# Patient Record
Sex: Male | Born: 1970 | Race: White | Hispanic: No | Marital: Single | Smoking: Current every day smoker
Health system: Southern US, Community
[De-identification: ages and names within clinical notes are randomized; demographics above are authoritative.]

## PROBLEM LIST (undated history)

## (undated) DIAGNOSIS — M549 Dorsalgia, unspecified: Secondary | ICD-10-CM

## (undated) DIAGNOSIS — R569 Unspecified convulsions: Secondary | ICD-10-CM

---

## 2001-10-28 ENCOUNTER — Ambulatory Visit (HOSPITAL_BASED_OUTPATIENT_CLINIC_OR_DEPARTMENT_OTHER): Admission: RE | Admit: 2001-10-28 | Discharge: 2001-10-28 | Payer: Self-pay | Admitting: Oral Surgery

## 2003-11-22 ENCOUNTER — Emergency Department (HOSPITAL_COMMUNITY): Admission: EM | Admit: 2003-11-22 | Discharge: 2003-11-22 | Payer: Self-pay | Admitting: Emergency Medicine

## 2003-11-25 ENCOUNTER — Ambulatory Visit (HOSPITAL_COMMUNITY): Admission: RE | Admit: 2003-11-25 | Discharge: 2003-11-25 | Payer: Self-pay | Admitting: Family Medicine

## 2004-02-21 ENCOUNTER — Ambulatory Visit: Payer: Self-pay | Admitting: Family Medicine

## 2004-04-11 ENCOUNTER — Ambulatory Visit: Payer: Self-pay | Admitting: Family Medicine

## 2004-09-21 ENCOUNTER — Ambulatory Visit: Payer: Self-pay | Admitting: Family Medicine

## 2004-10-03 ENCOUNTER — Ambulatory Visit: Payer: Self-pay | Admitting: Family Medicine

## 2005-05-17 ENCOUNTER — Ambulatory Visit: Payer: Self-pay | Admitting: Family Medicine

## 2005-05-23 ENCOUNTER — Ambulatory Visit: Payer: Self-pay | Admitting: Family Medicine

## 2005-10-29 IMAGING — CR DG HIP (WITH OR WITHOUT PELVIS) 2-3V*L*
3 series · 3 of 3 positions shown · non-contrast
Comparison: none

CLINICAL DATA: Low back pain and left hip pain.
 COMPLETE LUMBAR SPINE ? 4 VIEWS ? 11/25/03 
 The patient has only four typical lumbar segments.  There is no disk space narrowing, facet joint arthritis, pars defect, or other significant abnormality.
 IMPRESSION 
 No significant abnormality of the lumbar spine. The patient does have only four typical lumbar segments, an anatomic variant.
 LEFT HIP ? 2 VIEWS ? 11/25/03
 There is no evidence of fracture or dislocation.  No other significant bone or soft tissue abnormalities are identified.  The joint spaces are within normal limits.
 IMPRESSION
 Normal study.

[view not recorded (1 of 3)]
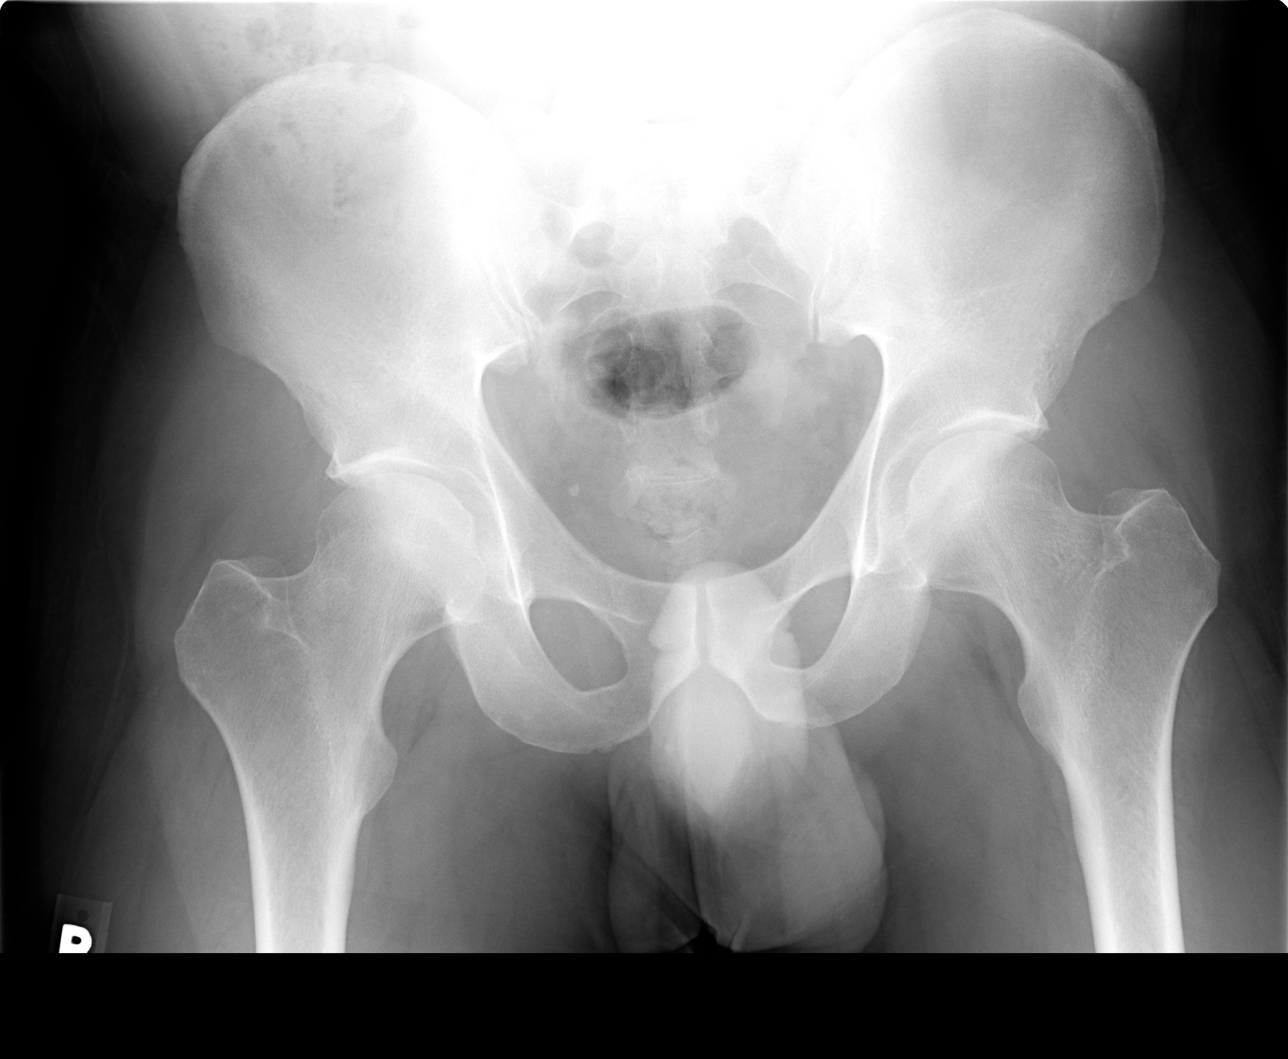

[view not recorded (2 of 3)]
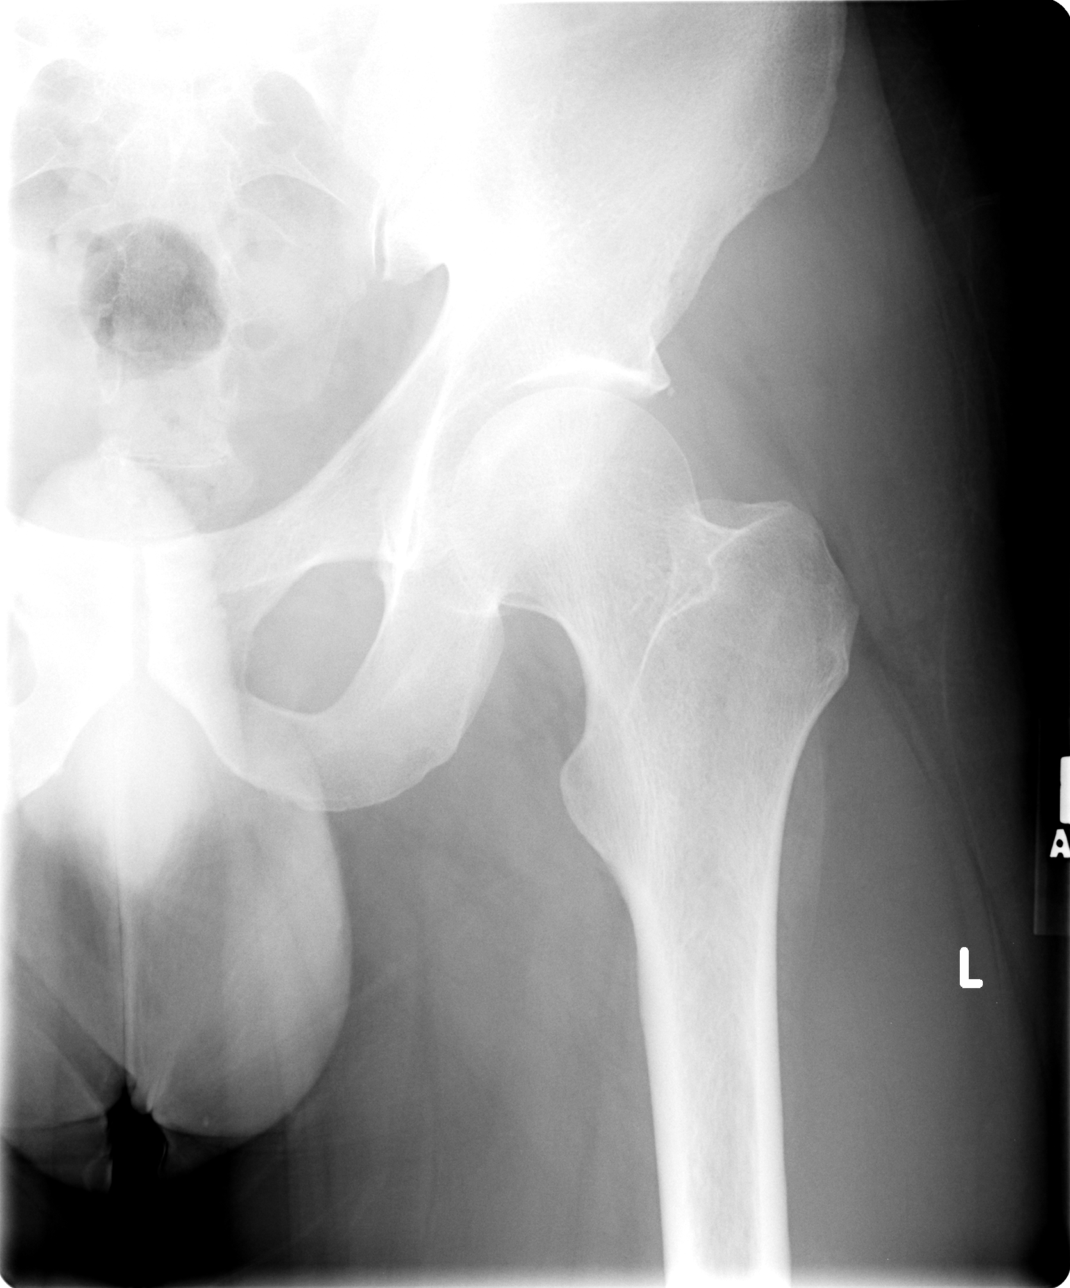

[view not recorded (3 of 3)]
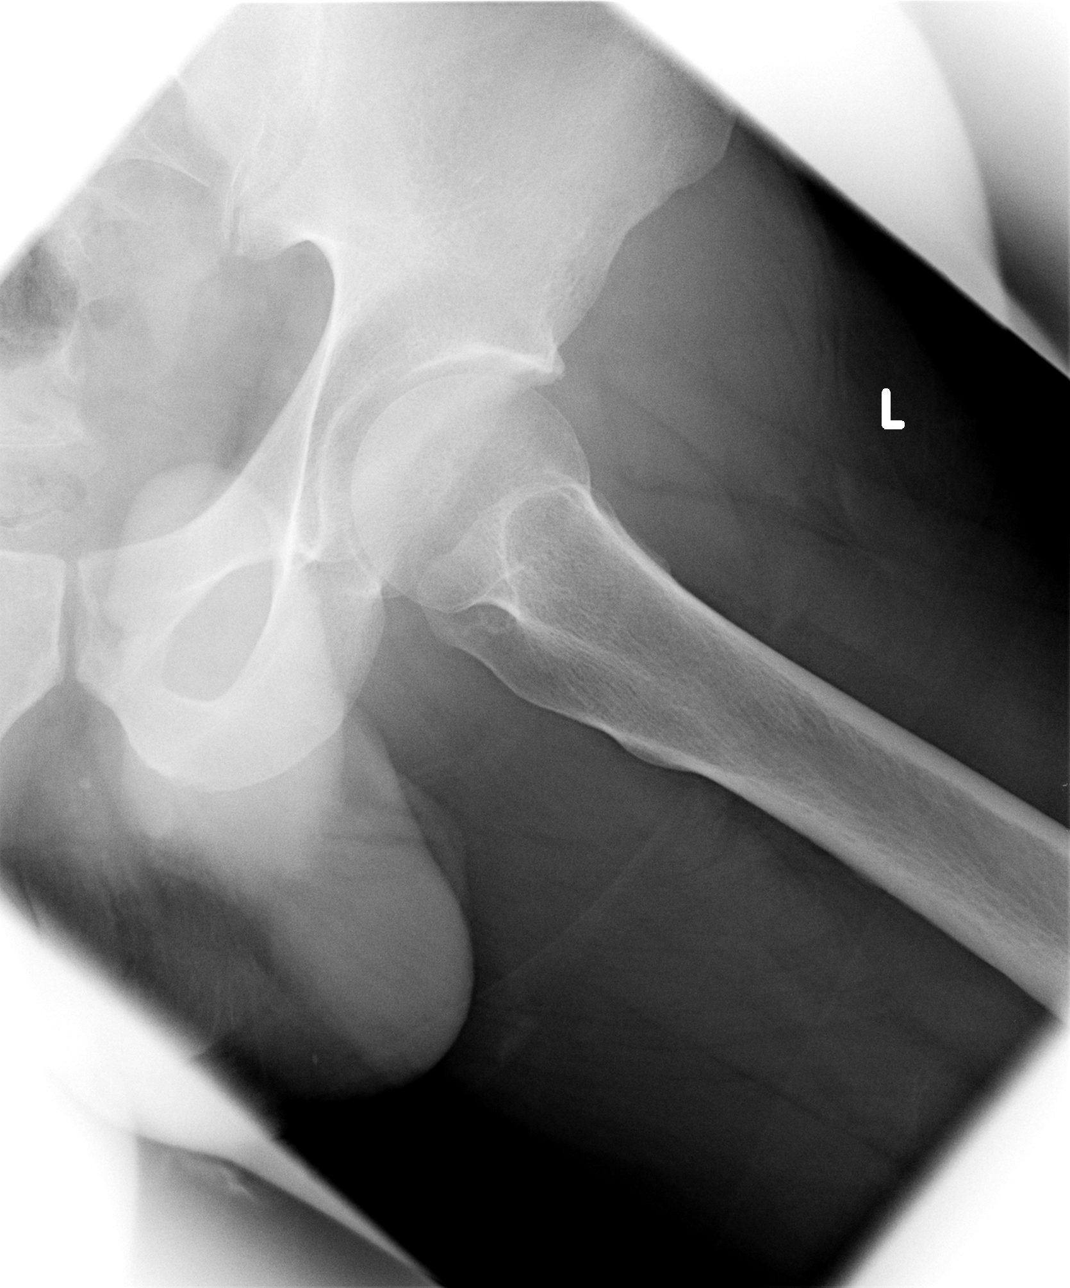

[3 of 3 positions shown; findings below may reference images not displayed]

## 2006-06-25 ENCOUNTER — Ambulatory Visit: Payer: Self-pay | Admitting: Family Medicine

## 2007-11-04 ENCOUNTER — Encounter
Admission: RE | Admit: 2007-11-04 | Discharge: 2007-11-04 | Payer: Self-pay | Admitting: Physical Medicine & Rehabilitation

## 2010-04-08 ENCOUNTER — Encounter: Payer: Self-pay | Admitting: Neurosurgery

## 2010-04-09 ENCOUNTER — Encounter: Payer: Self-pay | Admitting: Neurosurgery

## 2010-08-04 NOTE — Op Note (Signed)
[name], [name]                            ACCOUNT NO.:  [id number]   MEDICAL RECORD NO.:  [id number]                   PATIENT TYPE:   LOCATION:                                       FACILITY:   PHYSICIAN:  Georgia Lopes, M.D.               DATE OF BIRTH:   DATE OF PROCEDURE:  DATE OF DISCHARGE:                                 OPERATIVE REPORT   PREOPERATIVE DIAGNOSIS:  Multiple nonrestorable teeth #1, 2, 3, 4, 5, 7, 8,  9, 10, 12, 13, 14, 15, 17, 18, 19, 20, 21, 22, 23, 24, 25, 26, 27, 28, 29,  30, 31 and 32.   POSTOPERATIVE DIAGNOSIS:  Multiple nonrestorable teeth #1, 2, 3, 4, 5, 7, 8,  9, 10, 12, 13, 14, 15, 17, 18, 19, 20, 21, 22, 23, 24, 25, 26, 27, 28, 29,  30, 31 and 32.   PROCEDURES:  1. Removal of teeth numbers as above.  2. Alveoloplasty, four quadrant.   SURGEON:  Georgia Lopes, M.D.   ANESTHESIA:  General nasal.   ATTENDING:  Dr. Katrinka Blazing.   PROCEDURE:  The patient was taken to the operating room and placed on the  table in the supine position.  General anesthesia was administered  intravenously and a nasotracheal tube was placed atraumatically and secured.  The eyes were lubricated and protected.  A throat pack was placed and the  patient was prepped and draped for surgery.  Lidocaine, 2%, 1:100,000  epinephrine was infiltrated in the inferior alveolar block on the right and  left sides and in buccal and palatal infiltration of the maxilla.  A total  of eight carpules were used.  Then a bite block was placed on the right side  of the mouth and the left mandible was operated on first.  A #15 blade was  used to make a full-thickness incision overlying tooth #17.  This incision  was carried buccally in the gingival sulcus to tooth #27 and it was also  performed on the lingual side of the teeth.  The periosteum was reflected  with a periosteal elevator and then a 301 elevator was used to elevate all  teeth except for the third molar.  The lower teeth were  then removed with a  Calhorn forceps, a 150 forceps and an Asch forceps.  Tooth #17 was removed  using the Stryker handpiece with the Fisher bur to make a bony trough around  the tooth.  The tooth was then sectioned and removed.  The sockets were then  curetted.  An egg-shaped bur was then used to perform alveoloplasty and then  the area was irrigated and closed with 3-0 chromic.   In the left maxilla, a 15-blade was used to make a full-thickness incision  around the necks of teeth #15 - 8.  The periosteum was reflected.  The teeth  were elevated with a 301 elevator and  removed with a #150 forceps.  The  maxillary teeth #14 and 15 fractured and roots were removed with a root-tip  pick.  Then the sockets were curetted, irrigated.  Alveoloplasty was  performed with an egg-shaped bur and then the area was closed with 3-0  chromic.  The bite block was repositioned and in the right mandible teeth  #27 - 32 were removed using a #15 blade for a full-thickness incision, a 301  elevator, Calhorn forceps, 151 forceps and an Asch forceps.  Teeth #30 and  31 fractured during elevation and these teeth were sectioned with a  handpiece and the roots were removed independently with an Child psychotherapist.  Then tooth #32 was removed using a 702 Fisher bur which removed  bone around this tooth.  The tooth was sectioned and then removed in  multiple pieces.  The sockets were then curetted and an egg-shaped bur was  used to perform the alveoplasty.  The area was then irrigated and closed  with 3-0 chromic.   On the left maxilla, teeth #1 - 6 were removed with a full-thickness flap.  Using the periosteal elevator, a #15 blade and a 301 elevator was used to  elevate these teeth.  An upper 150 forceps was used to remove the tooth  from the mouth.  The sockets were then curetted, irrigated and an  alveoplasty was performed.  Then the area was irrigated and closed with 3-0  chromic.   Then 0.5% Marcaine,  1:200,000 epinephrine was infiltrated in the inferior  alveolar buccal on the right and left side and buccal infiltration of the  maxilla, a total five carpules were used.  At the termination of the  procedure, the oral cavity was suctioned.  The throat pack was removed.  Gauze sponges were placed in the mouth and then an oral area was placed. The  patient was awakened, extubated and taken to the recovery room, breathing  spontaneously, in good condition.   ESTIMATED BLOOD LOSS:  Minimum.   COMPLICATIONS:  None.   SPECIMENS:  None.                                               Georgia Lopes, M.D.    SMJ/MEDQ  D:  10/28/2001  T:  10/30/2001  Job:  [id number]

## 2012-06-06 ENCOUNTER — Encounter (HOSPITAL_COMMUNITY): Payer: Self-pay | Admitting: *Deleted

## 2012-06-06 ENCOUNTER — Emergency Department (HOSPITAL_COMMUNITY)
Admission: EM | Admit: 2012-06-06 | Discharge: 2012-06-06 | Disposition: A | Discharge status: Home / Self Care | Payer: Medicare Other | Attending: Emergency Medicine | Admitting: Emergency Medicine

## 2012-06-06 DIAGNOSIS — R569 Unspecified convulsions: Secondary | ICD-10-CM | POA: Insufficient documentation

## 2012-06-06 HISTORY — DX: Unspecified convulsions: R56.9

## 2012-06-06 LAB — POCT I-STAT, CHEM 8
Chloride: 109 mEq/L (ref 96–112)
HCT: 52 % (ref 39.0–52.0)
Hemoglobin: 17.7 g/dL — ABNORMAL HIGH (ref 13.0–17.0)
Potassium: 3.7 mEq/L (ref 3.5–5.1)
Sodium: 140 mEq/L (ref 135–145)

## 2012-06-06 LAB — PHENOBARBITAL LEVEL: Phenobarbital: 7.8 ug/mL — ABNORMAL LOW (ref 15.0–40.0)

## 2012-06-06 MED ORDER — LEVETIRACETAM 500 MG PO TABS
1000.0000 mg | ORAL_TABLET | Freq: Two times a day (BID) | ORAL | Status: DC
  Administered 2012-06-06: 1000 mg via ORAL
  Filled 2012-06-06 (×2): qty 2

## 2012-06-06 MED ORDER — PHENOBARBITAL 16.2 MG PO TABS
16.2000 mg | ORAL_TABLET | Freq: Every day | ORAL | Status: DC
  Administered 2012-06-06: 16.2 mg via ORAL

## 2012-06-06 MED ORDER — PHENOBARBITAL 32.4 MG PO TABS: 32.4000 mg | ORAL_TABLET | Freq: Every day | ORAL | Status: DC

## 2012-06-06 MED ORDER — LORAZEPAM 2 MG/ML IJ SOLN
1.0000 mg | Freq: Once | INTRAMUSCULAR | Status: AC
  Administered 2012-06-06: 1 mg via INTRAVENOUS
  Filled 2012-06-06: qty 1

## 2012-06-06 NOTE — ED Notes (Signed)
Per report from the rapid response nurse.  Pt was upstairs in the waiting area, during this time he looked over at one of the other visitors and said "I am about to have a seizure".  Visitor states that he then had a blank look on his face and began to have a seizure.  The seizure was reported to be tonic/clonic full body and lasted for 10 mins.  On arrival to ER pt is noted to be postictal but does open his eyes to voice.  No urinary incontinence noted.

## 2012-06-06 NOTE — ED Notes (Signed)
Pt wife called, pt is sleeping. Provided wife's number. Pt asking what time is it, states he has a h/a

## 2012-06-06 NOTE — ED Notes (Signed)
AIDET performed. 

## 2012-06-06 NOTE — ED Provider Notes (Addendum)
History     CSN: [id number]  Arrival date & time 06/06/12  1035   First MD Initiated Contact with Patient 06/06/12 1038      No chief complaint on file.   (Consider location/radiation/quality/duration/timing/severity/associated sxs/prior treatment) Patient is a 42 y.o. male presenting with seizures. The history is provided by a friend (rapid response nurse). The history is limited by the condition of the patient.  Seizures Seizure activity on arrival: yes   Seizure type:  Grand mal Preceding symptoms: aura   Preceding symptoms comment:  Pt was visiting a person in the hospital.  he told them he was going to have a sz and then started szing. Initial focality:  None Episode characteristics: generalized shaking and unresponsiveness   Postictal symptoms: confusion   Return to baseline: no   Severity:  Moderate Duration:  5 minutes Timing:  Once Number of seizures this episode:  1 Progression:  Partially resolved Context: not alcohol withdrawal   Context comment:  Unknown if taking meds or any med changes History of seizures: yes     No past medical history on file.  No past surgical history on file.  No family history on file.  History  Substance Use Topics  . Smoking status: Not on file  . Smokeless tobacco: Not on file  . Alcohol Use: Not on file      Review of Systems  Unable to perform ROS Neurological: Positive for seizures.    Allergies  Review of patient's allergies indicates not on file.  Home Medications  No current outpatient prescriptions on file.  BP 190/102  Pulse 109  Temp(Src) 97.9 F (36.6 C) (Oral)  Resp 22  SpO2 100%  Physical Exam  Nursing note and vitals reviewed. Constitutional: He appears well-developed and well-nourished. No distress.  Patient is post ictal on exam  HENT:  Head: Normocephalic and atraumatic.  Mouth/Throat: Oropharynx is clear and moist.  Eyes: Conjunctivae and EOM are normal. Pupils are equal, round, and  reactive to light.  Neck: Normal range of motion. Neck supple.  Cardiovascular: Regular rhythm and intact distal pulses.  Tachycardia present.   No murmur heard. Pulmonary/Chest: Effort normal and breath sounds normal. No respiratory distress. He has no wheezes. He has no rales.  Abdominal: Soft. He exhibits no distension. There is no tenderness. There is no rebound and no guarding.  Musculoskeletal: Normal range of motion. He exhibits no edema and no tenderness.  Neurological: He has normal strength. No sensory deficit.  Patient does move all 4 extremities. He opens his eyes to voice but currently is not speaking and appears confused  Skin: Skin is warm. No rash noted. He is diaphoretic. No erythema.  Psychiatric: He has a normal mood and affect. His behavior is normal.    ED Course  Procedures (including critical care time)  Labs Reviewed  PHENOBARBITAL LEVEL - Abnormal; Notable for the following:    Phenobarbital 7.8 (*)    All other components within normal limits  POCT I-STAT, CHEM 8 - Abnormal; Notable for the following:    BUN 4 (*)    Glucose, Bld 161 (*)    Calcium, Ion 1.25 (*)    Hemoglobin 17.7 (*)    All other components within normal limits   No results found.   Date: 06/06/2012  Rate: 113  Rhythm: sinus tachycardia  QRS Axis: normal  Intervals: normal  ST/T Wave abnormalities: normal  Conduction Disutrbances:none  Narrative Interpretation:   Old EKG Reviewed: none available  1. Seizure       MDM   Patient was brought down from upstairs in the hospital after he had a 5-10 minute generalized tonic-clonic seizure. Patient has a history of seizures and prior to seizing told the person he came to visit that he was going to have a seizure. He is currently not seizing and does respond to voice. He is currently postictal. Currently unable to determine what medications he is on. Will give patient 1 mg of Ativan to prevent further seizing. EKG shows a sinus  tachycardia. Will outpatient to wake up so he can tell us his medications. There was no injury as patient was sitting in a chair when he started seizing and the nurse who was called states he was still in the chair when she got there.  12:06 PM Patient is on phenobarbital and Keppra. His phenobarbital level today is low at 7.8.  12:17 PM Patient is currently asleep and still mildly postictal. Will allow completely resolved before discharge.  Discussing with pt he has not had meds for over a week due to not picking them up from the drug store.  This would explain sz and subtherapeutic levels    Gwyneth Sprout, MD 06/06/12 1217  Gwyneth Sprout, MD 06/06/12 1259  Gwyneth Sprout, MD 06/06/12 1544

## 2012-06-06 NOTE — ED Notes (Signed)
Pt is becoming more alert.  He is oriented to place and day of week.  He states that he takes phenobarbital and keppra.

## 2015-04-16 ENCOUNTER — Emergency Department (HOSPITAL_COMMUNITY)
Admission: EM | Admit: 2015-04-16 | Discharge: 2015-04-16 | Disposition: A | Discharge status: Home / Self Care | Payer: Medicare Other | Attending: Emergency Medicine | Admitting: Emergency Medicine

## 2015-04-16 ENCOUNTER — Encounter (HOSPITAL_COMMUNITY): Payer: Self-pay | Admitting: Emergency Medicine

## 2015-04-16 DIAGNOSIS — F17210 Nicotine dependence, cigarettes, uncomplicated: Secondary | ICD-10-CM | POA: Insufficient documentation

## 2015-04-16 DIAGNOSIS — Z79899 Other long term (current) drug therapy: Secondary | ICD-10-CM | POA: Diagnosis not present

## 2015-04-16 DIAGNOSIS — Z760 Encounter for issue of repeat prescription: Secondary | ICD-10-CM | POA: Diagnosis present

## 2015-04-16 DIAGNOSIS — M545 Low back pain, unspecified: Secondary | ICD-10-CM

## 2015-04-16 LAB — URINALYSIS, ROUTINE W REFLEX MICROSCOPIC
Bilirubin Urine: NEGATIVE
Glucose, UA: NEGATIVE mg/dL
Hgb urine dipstick: NEGATIVE
Ketones, ur: NEGATIVE mg/dL
Leukocytes, UA: NEGATIVE
Nitrite: NEGATIVE
Protein, ur: NEGATIVE mg/dL
Specific Gravity, Urine: 1.015 (ref 1.005–1.030)
pH: 7 (ref 5.0–8.0)

## 2015-04-16 MED ORDER — PHENOBARBITAL 16.2 MG PO TABS: 16.2000 mg | ORAL_TABLET | Freq: Two times a day (BID) | ORAL | Status: DC

## 2015-04-16 NOTE — ED Notes (Signed)
Patient requesting medication refill. Per patient has ran out of his phenobarbital, in which he takes twice a day for seizures. Per patient last dose yesterday and doesn't have appointment with PCP until 2/5. Patient also c/o lower back pain that radiates into testicles. Per patient painful urination. Denies any discharge or swelling. Patient reports taking ibuprofen, tylenol, ice, and heat with no relief.

## 2015-04-16 NOTE — ED Provider Notes (Signed)
CSN: [id number]     Arrival date & time 04/16/15  1509 History   First MD Initiated Contact with Patient 04/16/15 1526     Chief Complaint  Patient presents with  . Medication Refill     (Consider location/radiation/quality/duration/timing/severity/associated sxs/prior Treatment) HPI  45 year old male requesting for refill of phenobarbital.Has been on this for years. Recently ran out does not have means to have a refill for a few days. He reports he has adequate supply of his other medications. Additionally complaining of intermittent left-sided flank pain.Describes a sharp. When he has itis very uncomfortable and could not do anything to provide himself and relief.  He'll socialize up side though. Denies any past history kidney stones. No urinary complaints. He currently denies his pain has not had it in at least several days.  Past Medical History  Diagnosis Date  . Seizures (HCC)    History reviewed. No pertinent past surgical history. History reviewed. No pertinent family history. Social History  Substance Use Topics  . Smoking status: Current Every Day Smoker -- 0.50 packs/day    Types: Cigarettes  . Smokeless tobacco: Never Used  . Alcohol Use: No    Review of Systems  All systems reviewed and negative, other than as noted in HPI.   Allergies  Review of patient's allergies indicates no known allergies.  Home Medications   Prior to Admission medications   Medication Sig Start Date End Date Taking? Authorizing Provider  amLODipine (NORVASC) 2.5 MG tablet Take 2.5 mg by mouth daily.    Historical Provider, MD  lamoTRIgine (LAMICTAL) 100 MG tablet Take 100 mg by mouth 2 (two) times daily.    Historical Provider, MD  levETIRAcetam (KEPPRA) 500 MG tablet Take 1,000 mg by mouth every 12 (twelve) hours.    Historical Provider, MD  phenobarbital (LUMINAL) 16.2 MG tablet Take 16.2-32.4 mg by mouth 2 (two) times daily. 1 in the am and 2 at night    Historical Provider, MD   sertraline (ZOLOFT) 50 MG tablet Take 50 mg by mouth at bedtime.    Historical Provider, MD   BP 137/96 mmHg  Pulse 68  Temp(Src) 98.9 F (37.2 C) (Oral)  Resp 16  Ht  (1.676 m)  Wt 160 lb (72.576 kg)  BMI 25.84 kg/m2  SpO2 100% Physical Exam  Constitutional: He appears well-developed and well-nourished. No distress.  HENT:  Head: Normocephalic and atraumatic.  Eyes: Conjunctivae are normal. Right eye exhibits no discharge. Left eye exhibits no discharge.  Neck: Neck supple.  Cardiovascular: Normal rate, regular rhythm and normal heart sounds.  Exam reveals no gallop and no friction rub.   No murmur heard. Pulmonary/Chest: Effort normal and breath sounds normal. No respiratory distress.  Abdominal: Soft. He exhibits no distension. There is no tenderness.  Musculoskeletal: He exhibits no edema or tenderness.  Neurological: He is alert.  Skin: Skin is warm and dry.  Psychiatric: He has a normal mood and affect. His behavior is normal. Thought content normal.  Nursing note and vitals reviewed.   ED Course  Procedures (including critical care time) Labs Review Labs Reviewed - No data to display  Imaging Review No results found. I have personally reviewed and evaluated these images and lab results as part of my medical decision-making.   EKG Interpretation None      MDM   Final diagnoses:  Medication refill  Left-sided low back pain without sciatica    Medication refill. Intermittently, left lower back pain which actually sounds like  ureteral colic. Currently none. UA unremarkable. Outpatient follow-up if he has continued symptoms.    Raeford Razor, MD 04/20/15 [phone]

## 2015-04-16 NOTE — Discharge Instructions (Signed)

## 2015-05-03 ENCOUNTER — Emergency Department (HOSPITAL_COMMUNITY)
Admission: EM | Admit: 2015-05-03 | Discharge: 2015-05-03 | Disposition: A | Discharge status: Home / Self Care | Payer: Medicare Other | Attending: Emergency Medicine | Admitting: Emergency Medicine

## 2015-05-03 ENCOUNTER — Encounter (HOSPITAL_COMMUNITY): Payer: Self-pay | Admitting: Emergency Medicine

## 2015-05-03 DIAGNOSIS — Z760 Encounter for issue of repeat prescription: Secondary | ICD-10-CM

## 2015-05-03 DIAGNOSIS — R062 Wheezing: Secondary | ICD-10-CM | POA: Diagnosis not present

## 2015-05-03 DIAGNOSIS — Z79899 Other long term (current) drug therapy: Secondary | ICD-10-CM | POA: Diagnosis not present

## 2015-05-03 DIAGNOSIS — F17210 Nicotine dependence, cigarettes, uncomplicated: Secondary | ICD-10-CM | POA: Insufficient documentation

## 2015-05-03 HISTORY — DX: Dorsalgia, unspecified: M54.9

## 2015-05-03 MED ORDER — PHENOBARBITAL 16.2 MG PO TABS: 16.2000 mg | ORAL_TABLET | Freq: Two times a day (BID) | ORAL | Status: AC

## 2015-05-03 NOTE — Discharge Instructions (Signed)
Medicine Refill at the Emergency Department  We have refilled your medicine today, but it is best for you to get refills through your primary health care provider's office. In the future, please plan ahead so you do not need to get refills from the emergency department.  If the medicine we refilled was a maintenance medicine, you may have received only enough to get you by until you are able to see your regular health care provider.     This information is not intended to replace advice given to you by your health care provider. Make sure you discuss any questions you have with your health care provider.     Document Released: 06/22/2003 Document Revised: 03/26/2014 Document Reviewed: 06/12/2013  Elsevier Interactive Patient Education 2016 [employer]

## 2015-05-03 NOTE — ED Notes (Addendum)
Pt states he has been unable to see his new dr. Per pt last appt was canceled & could not be seen until March. Here to get meds refilled. Note in care everwhere shows note on this pt from yesterday. Would not refill because pt has not followed up as directed.

## 2015-05-03 NOTE — ED Notes (Signed)
Pt alert & oriented x4, stable gait. Patient given discharge instructions, paperwork & prescription(s). Patient instructed to stop at the registration desk to finish any additional paperwork. Patient verbalized understanding that we could no longer fill medications from the ER. Pt left department w/ no further questions.

## 2015-05-03 NOTE — ED Notes (Addendum)
PT states had neurology appt for this past Friday and the doctor's office cancelled all patients that day and is rescheduled for 06/09/15. PT states ran out of seizure medication and last dose was yesterday.

## 2015-05-03 NOTE — ED Provider Notes (Signed)
CSN: [id number]     Arrival date & time 05/03/15  1605 History   First MD Initiated Contact with Patient 05/03/15 1651     Chief Complaint  Patient presents with  . Medication Refill     (Consider location/radiation/quality/duration/timing/severity/associated sxs/prior Treatment) The history is provided by the patient.   [name] is a 45 y.o. male presenting for assistance with medication refill.  He reports running out of his phenobarbitol, last dose was taken 3 days ago.  He states he is currently pending a referral appointment with a new neurologist in [place], referred by his pcp.  Had appt last week which was postponed until 3/23.  He states his pcp is unable to provided this medicine for him, unsure of the reason why.  He denies any seizure like symptoms/prodromal sx at this time.  Describes nightly partial seizures even with medicine.      Past Medical History  Diagnosis Date  . Seizures (HCC)   . Back pain    History reviewed. No pertinent past surgical history. History reviewed. No pertinent family history. Social History  Substance Use Topics  . Smoking status: Current Every Day Smoker -- 0.50 packs/day    Types: Cigarettes  . Smokeless tobacco: Never Used  . Alcohol Use: No    Review of Systems  Constitutional: Negative for fever.  HENT: Negative for congestion and sore throat.   Eyes: Negative.   Respiratory: Negative for chest tightness and shortness of breath.   Cardiovascular: Negative for chest pain.  Gastrointestinal: Negative for nausea and abdominal pain.  Genitourinary: Negative.   Musculoskeletal: Negative for joint swelling, arthralgias and neck pain.  Skin: Negative.  Negative for rash and wound.  Neurological: Negative for dizziness, weakness, light-headedness, numbness and headaches.  Psychiatric/Behavioral: Negative.       Allergies  Review of patient's allergies indicates no known allergies.  Home Medications   Prior to Admission  medications   Medication Sig Start Date End Date Taking? Authorizing Provider  amLODipine (NORVASC) 10 MG tablet Take 10 mg by mouth daily. 04/05/15   Historical Provider, MD  amLODipine (NORVASC) 2.5 MG tablet Take 2.5 mg by mouth daily.    Historical Provider, MD  lamoTRIgine (LAMICTAL) 100 MG tablet Take 100 mg by mouth 2 (two) times daily.    Historical Provider, MD  levETIRAcetam (KEPPRA) 500 MG tablet Take 1,000 mg by mouth every 12 (twelve) hours.    Historical Provider, MD  phenobarbital (LUMINAL) 16.2 MG tablet Take 1-2 tablets (16.2-32.4 mg total) by mouth 2 (two) times daily. 1 in the am and 2 at night 05/03/15   Burgess Amor, PA-C  sertraline (ZOLOFT) 50 MG tablet Take 50 mg by mouth at bedtime.    Historical Provider, MD   BP 144/84 mmHg  Pulse 66  Temp(Src) 98.4 F (36.9 C) (Oral)  Resp 18  Ht  (1.676 m)  Wt 72.576 kg  BMI 25.84 kg/m2  SpO2 99% Physical Exam  Constitutional: He is oriented to person, place, and time. He appears well-developed and well-nourished.  HENT:  Head: Normocephalic and atraumatic.  Eyes: Pupils are equal, round, and reactive to light.  Cardiovascular: Normal rate and regular rhythm.   Pulmonary/Chest: Effort normal. He has wheezes.  Musculoskeletal: Normal range of motion.  Neurological: He is alert and oriented to person, place, and time.  Grossly intact.  Skin: Skin is warm and dry.  Psychiatric: He has a normal mood and affect.  Nursing note and vitals reviewed.  ED Course  Procedures (including critical care time) Labs Review Labs Reviewed - No data to display  Imaging Review No results found. I have personally reviewed and evaluated these images and lab results as part of my medical decision-making.   EKG Interpretation None      MDM   Final diagnoses:  Medication refill    Review of care everywhere reflects Dr. Sherlie Ban phone note advising pt she would give no further phenobarbital prescriptions as pt has been avoiding  neuro consult eval for over a year.  Pt denies this statement.  Discussed with Dr. Estell Harpin - will give a one week prescription.  Discussed with pt the ed policy on medication refills and we will not be able to provide any further refills of this medicine.  He was strongly encouraged to f/u with pcp and/or neurology for further management of this chronic condition.  Pt states understanding.    Burgess Amor, PA-C 05/03/15 1731  Bethann Berkshire, MD 05/03/15 [phone]

## 2015-07-20 ENCOUNTER — Encounter (HOSPITAL_COMMUNITY): Payer: Self-pay

## 2015-07-20 ENCOUNTER — Emergency Department (HOSPITAL_COMMUNITY)
Admission: EM | Admit: 2015-07-20 | Discharge: 2015-07-20 | Disposition: A | Discharge status: Home / Self Care | Payer: Medicare Other | Attending: Emergency Medicine | Admitting: Emergency Medicine

## 2015-07-20 ENCOUNTER — Emergency Department (HOSPITAL_COMMUNITY): Payer: Medicare Other

## 2015-07-20 DIAGNOSIS — T701XXA Sinus barotrauma, initial encounter: Secondary | ICD-10-CM | POA: Insufficient documentation

## 2015-07-20 DIAGNOSIS — R0981 Nasal congestion: Secondary | ICD-10-CM | POA: Diagnosis not present

## 2015-07-20 DIAGNOSIS — R531 Weakness: Secondary | ICD-10-CM | POA: Insufficient documentation

## 2015-07-20 DIAGNOSIS — R05 Cough: Secondary | ICD-10-CM | POA: Diagnosis present

## 2015-07-20 DIAGNOSIS — J40 Bronchitis, not specified as acute or chronic: Secondary | ICD-10-CM | POA: Insufficient documentation

## 2015-07-20 DIAGNOSIS — F17210 Nicotine dependence, cigarettes, uncomplicated: Secondary | ICD-10-CM | POA: Insufficient documentation

## 2015-07-20 DIAGNOSIS — H53149 Visual discomfort, unspecified: Secondary | ICD-10-CM | POA: Diagnosis not present

## 2015-07-20 MED ORDER — IPRATROPIUM-ALBUTEROL 0.5-2.5 (3) MG/3ML IN SOLN
3.0000 mL | Freq: Once | RESPIRATORY_TRACT | Status: AC
  Administered 2015-07-20: 3 mL via RESPIRATORY_TRACT
  Filled 2015-07-20: qty 3

## 2015-07-20 MED ORDER — PREDNISONE 20 MG PO TABS: 40.0000 mg | ORAL_TABLET | Freq: Every day | ORAL | Status: AC

## 2015-07-20 MED ORDER — ALBUTEROL SULFATE HFA 108 (90 BASE) MCG/ACT IN AERS
2.0000 | INHALATION_SPRAY | Freq: Once | RESPIRATORY_TRACT | Status: AC
  Administered 2015-07-20: 2 via RESPIRATORY_TRACT
  Filled 2015-07-20: qty 6.7

## 2015-07-20 MED ORDER — AZITHROMYCIN 250 MG PO TABS: 250.0000 mg | ORAL_TABLET | Freq: Every day | ORAL | Status: AC

## 2015-07-20 MED ORDER — ALBUTEROL SULFATE (2.5 MG/3ML) 0.083% IN NEBU
2.5000 mg | INHALATION_SOLUTION | Freq: Once | RESPIRATORY_TRACT | Status: AC
  Administered 2015-07-20: 2.5 mg via RESPIRATORY_TRACT
  Filled 2015-07-20: qty 3

## 2015-07-20 MED ORDER — BENZONATATE 100 MG PO CAPS: 100.0000 mg | ORAL_CAPSULE | Freq: Three times a day (TID) | ORAL | Status: AC

## 2015-07-20 NOTE — ED Provider Notes (Signed)
CSN: [id number]     Arrival date & time 07/20/15  1304 History   First MD Initiated Contact with Patient 07/20/15 1329     Chief Complaint  Patient presents with  . Weakness  . Cough     (Consider location/radiation/quality/duration/timing/severity/associated sxs/prior Treatment) HPI   [name] is a 45 y.o. male who presents to the Emergency Department complaining of cough, generalized weakness and congestion for one week.  He also reports associated fatigue, sinus pressure and loss of appetite.  He states that his daughter also had similar symptoms recently.  He has been taking ibuprofen and tylenol, drinking fluids without relief.  He denies chest pain, fever, abd pain, shortness of breath.    Past Medical History  Diagnosis Date  . Seizures (HCC)   . Back pain    History reviewed. No pertinent past surgical history. No family history on file. Social History  Substance Use Topics  . Smoking status: Current Every Day Smoker -- 0.50 packs/day    Types: Cigarettes  . Smokeless tobacco: Never Used  . Alcohol Use: No    Review of Systems  Constitutional: Positive for chills and fatigue. Negative for fever and appetite change.  HENT: Positive for congestion and sinus pressure. Negative for sore throat and trouble swallowing.   Eyes: Positive for photophobia.  Respiratory: Positive for cough, chest tightness, shortness of breath and wheezing.   Cardiovascular: Negative for chest pain.  Gastrointestinal: Negative for nausea, vomiting and abdominal pain.  Genitourinary: Negative for dysuria.  Musculoskeletal: Negative for arthralgias, gait problem, neck pain and neck stiffness.  Skin: Negative for rash.  Neurological: Positive for weakness and headaches. Negative for dizziness and numbness.  Hematological: Negative for adenopathy.  All other systems reviewed and are negative.     Allergies  Review of patient's allergies indicates no known allergies.  Home Medications    Prior to Admission medications   Medication Sig Start Date End Date Taking? Authorizing Provider  amLODipine (NORVASC) 10 MG tablet Take 10 mg by mouth daily. 04/05/15   Historical Provider, MD  amLODipine (NORVASC) 2.5 MG tablet Take 2.5 mg by mouth daily.    Historical Provider, MD  lamoTRIgine (LAMICTAL) 100 MG tablet Take 100 mg by mouth 2 (two) times daily.    Historical Provider, MD  levETIRAcetam (KEPPRA) 500 MG tablet Take 1,000 mg by mouth every 12 (twelve) hours.    Historical Provider, MD  phenobarbital (LUMINAL) 16.2 MG tablet Take 1-2 tablets (16.2-32.4 mg total) by mouth 2 (two) times daily. 1 in the am and 2 at night 05/03/15   Burgess Amor, PA-C  sertraline (ZOLOFT) 50 MG tablet Take 50 mg by mouth at bedtime.    Historical Provider, MD   BP 115/78 mmHg  Pulse 75  Temp(Src) 97.7 F (36.5 C) (Oral)  Resp 16  Ht  (1.676 m)  Wt 70.308 kg  BMI 25.03 kg/m2  SpO2 99% Physical Exam  Constitutional: He is oriented to person, place, and time. He appears well-developed and well-nourished. No distress.  HENT:  Head: Normocephalic and atraumatic.  Right Ear: Tympanic membrane and ear canal normal.  Left Ear: Tympanic membrane and ear canal normal.  Mouth/Throat: Uvula is midline, oropharynx is clear and moist and mucous membranes are normal. No oropharyngeal exudate.  Eyes: EOM are normal. Pupils are equal, round, and reactive to light.  Neck: Normal range of motion, full passive range of motion without pain and phonation normal. Neck supple.  Cardiovascular: Normal rate, regular rhythm  and intact distal pulses.   No murmur heard. Pulmonary/Chest: Effort normal. No stridor. No respiratory distress. He has wheezes. He has no rales. He exhibits no tenderness.  Inspiratory wheezes bilaterally.  No rales, no respiratory distress.  Musculoskeletal: He exhibits no edema.  Lymphadenopathy:    He has no cervical adenopathy.  Neurological: He is alert and oriented to person, place,  and time. He exhibits normal muscle tone. Coordination normal.  Skin: Skin is warm and dry.  Psychiatric: He has a normal mood and affect.  Nursing note and vitals reviewed.   ED Course  Procedures (including critical care time) Labs Review Labs Reviewed - No data to display  Imaging Review Dg Chest 2 View  07/20/2015  CLINICAL DATA:  45 year old with 1 week history of cough and intermittent fever. Patient states that his daughter is currently being treated for pneumonia. Current smoker. EXAM: CHEST  2 VIEW COMPARISON:  07/31/2014 and earlier. FINDINGS: Cardiomediastinal silhouette unremarkable, unchanged. Lungs clear. Bronchovascular markings normal. Pulmonary vascularity normal. No visible pleural effusions. No pneumothorax. Mild degenerative changes involving the lower thoracic spine. No significant interval change. IMPRESSION: No acute cardiopulmonary disease. Electronically Signed   By: Hulan Saashomas  Lawrence M.D.   On: 07/20/2015 13:47   I have personally reviewed and evaluated these images and lab results as part of my medical decision-making.   EKG Interpretation None      MDM   Final diagnoses:  Bronchitis    Lung sounds improved after neb.  XR neg for PNA.  Likely bronchitis.  Vitals stable, no hypoxia.    Albuterol inhaler dispensed.  He appears stable for d/c and Rx written for zithromax, prednisone, anti-tussive and he agrees to close PMD f/u if needed.     Pauline Ausammy Tatsuo Musial, PA-C 07/23/15 1337  Donnetta HutchingBrian Cook, MD 08/05/15 1504

## 2015-07-20 NOTE — ED Notes (Signed)
Patient states chest congestion and cough for a few days. Patient states daughter was diagnosed with URI or the flu and he feels like he has it too. Patient is a daily smoker, and states he has a productive cough.

## 2015-07-20 NOTE — ED Notes (Addendum)
Pt reports that his daughter has been sick and was dx with pneumonia. Pt states he has been coughing for a week. Generalized weakness . No appetite and doesn't have an appetite

## 2015-07-20 NOTE — ED Notes (Signed)
Patient verbalizes understanding of discharge instructions, prescriptions, home care and follow up care. Patient out of department at this time. 

## 2017-06-23 IMAGING — DX DG CHEST 2V
2 series · 2 of 2 positions shown · non-contrast
Comparison: 07/31/2014 and earlier.

CLINICAL DATA: 45-year-old with 1 week history of cough and
intermittent fever. Patient states that his daughter is currently
being treated for pneumonia. Current smoker.

EXAM:
CHEST  2 VIEW

[chest pa]
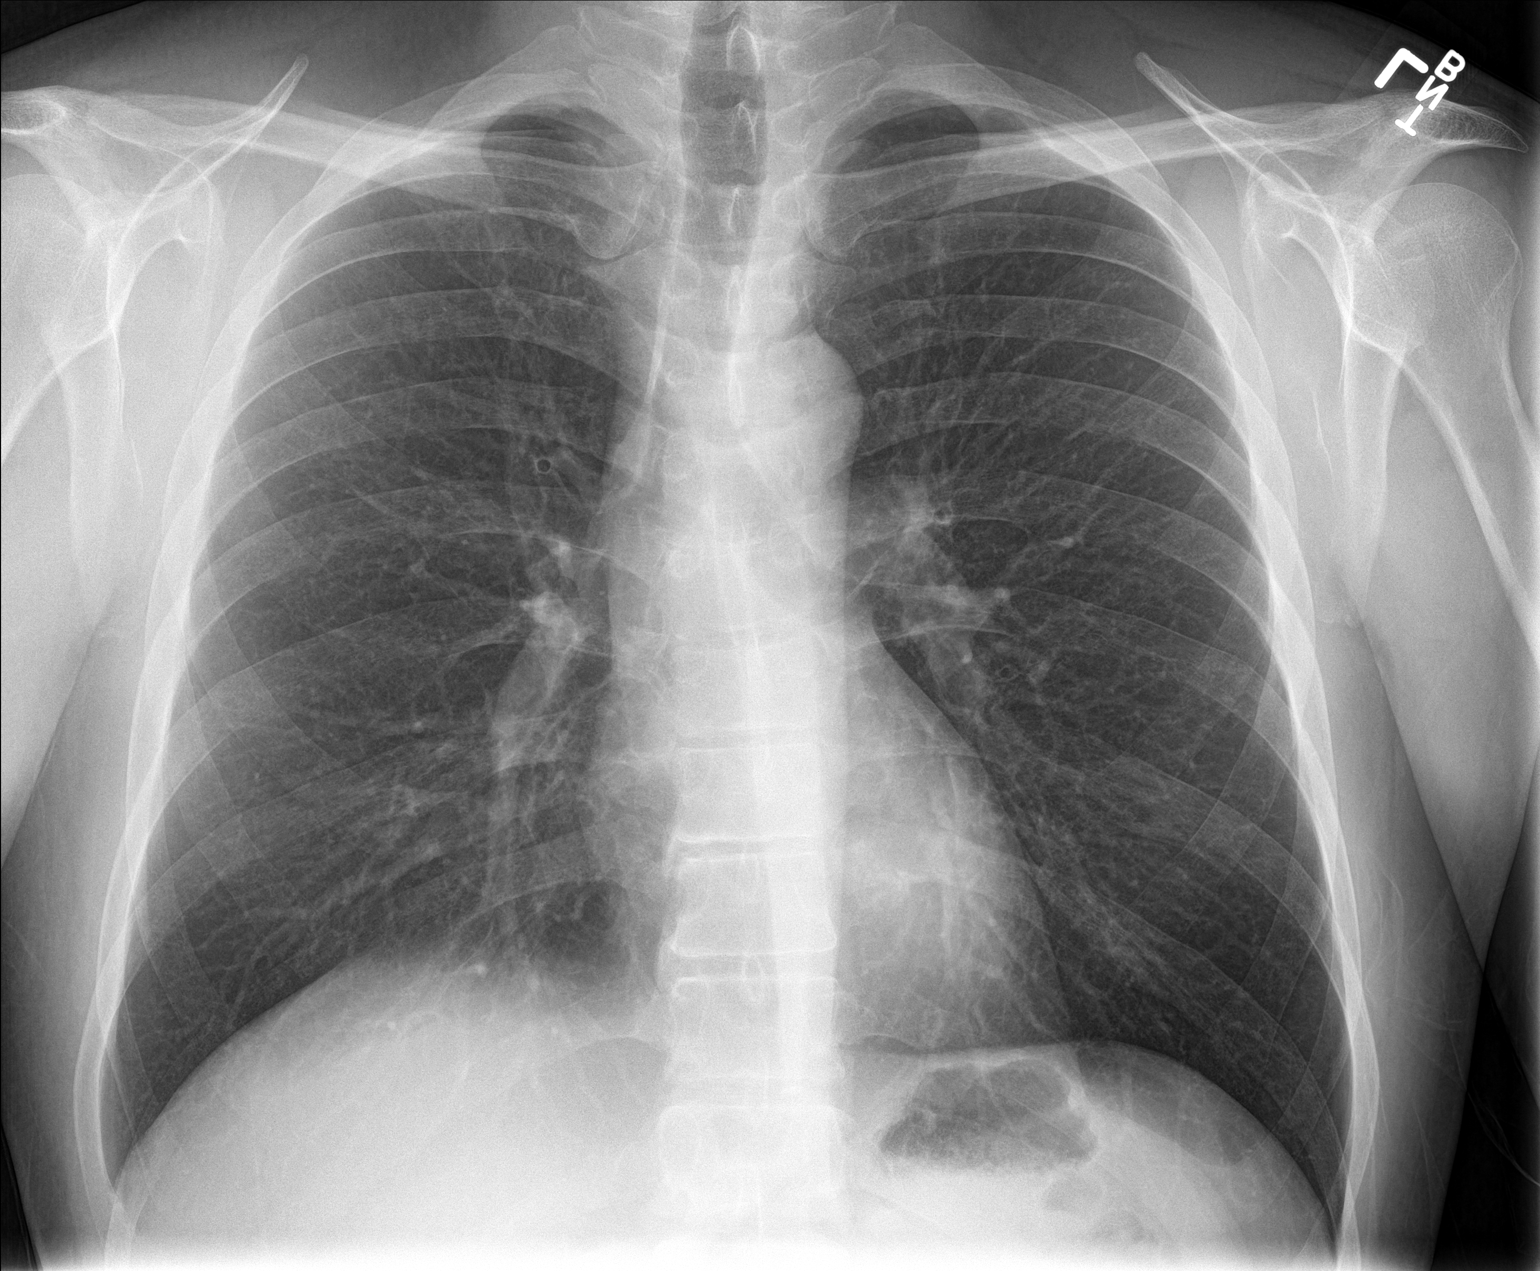

[chest lat]
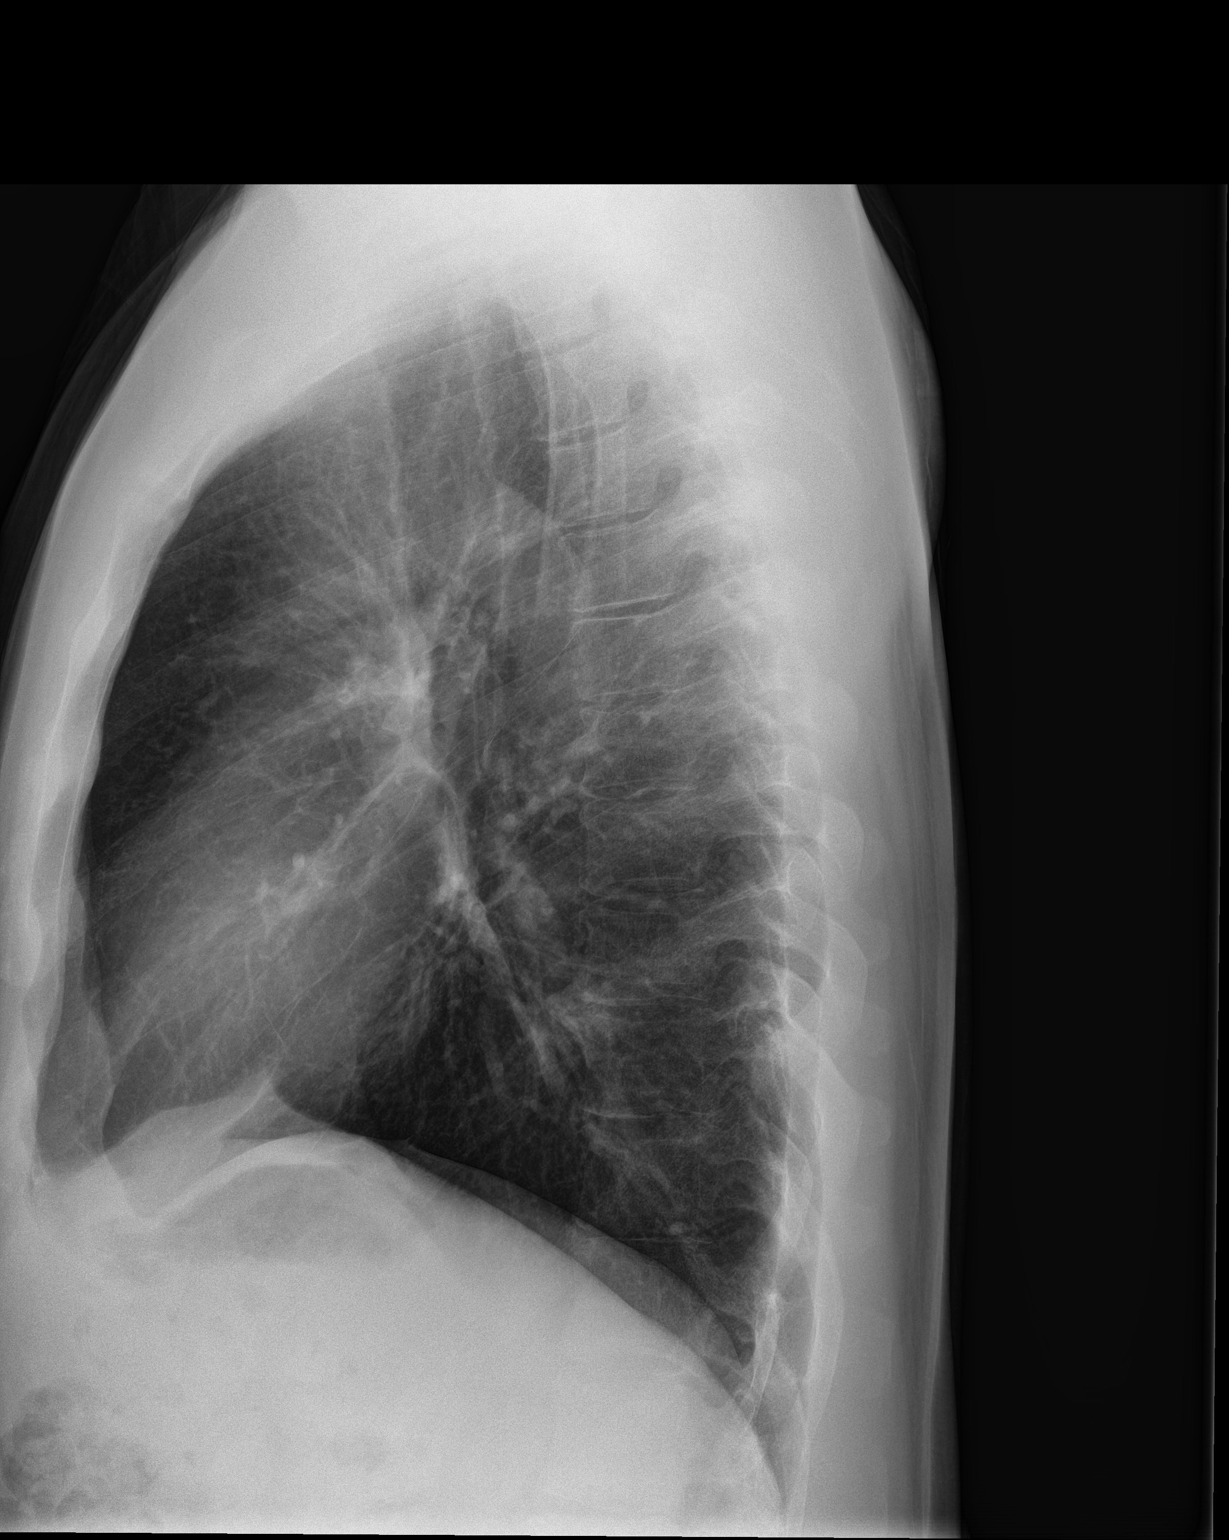

[2 of 2 positions shown; findings below may reference images not displayed]

FINDINGS: Cardiomediastinal silhouette unremarkable, unchanged. Lungs clear.
Bronchovascular markings normal. Pulmonary vascularity normal. No
visible pleural effusions. No pneumothorax. Mild degenerative
changes involving the lower thoracic spine. No significant interval
change.
IMPRESSION: No acute cardiopulmonary disease.
